# Patient Record
Sex: Female | Born: 1995 | Race: Black or African American | Hispanic: No | Marital: Single | Smoking: Never smoker
Health system: Southern US, Community
[De-identification: ages and names within clinical notes are randomized; demographics above are authoritative.]

---

## 2014-02-03 ENCOUNTER — Encounter (HOSPITAL_COMMUNITY): Payer: Self-pay | Admitting: Emergency Medicine

## 2014-02-03 ENCOUNTER — Emergency Department (HOSPITAL_COMMUNITY)
Admission: EM | Admit: 2014-02-03 | Discharge: 2014-02-03 | Disposition: A | Discharge status: Home / Self Care | Payer: BC Managed Care – PPO | Attending: Emergency Medicine | Admitting: Emergency Medicine

## 2014-02-03 DIAGNOSIS — N8320 Unspecified ovarian cysts: Secondary | ICD-10-CM | POA: Diagnosis not present

## 2014-02-03 DIAGNOSIS — N83209 Unspecified ovarian cyst, unspecified side: Secondary | ICD-10-CM

## 2014-02-03 DIAGNOSIS — R109 Unspecified abdominal pain: Secondary | ICD-10-CM | POA: Diagnosis present

## 2014-02-03 LAB — CBC WITH DIFFERENTIAL/PLATELET
BASOS ABS: 0 10*3/uL (ref 0.0–0.1)
Basophils Relative: 0 % (ref 0–1)
EOS ABS: 0 10*3/uL (ref 0.0–0.7)
EOS PCT: 0 % (ref 0–5)
HEMATOCRIT: 28.1 % — AB (ref 36.0–46.0)
Hemoglobin: 9.6 g/dL — ABNORMAL LOW (ref 12.0–15.0)
LYMPHS ABS: 1.4 10*3/uL (ref 0.7–4.0)
LYMPHS PCT: 21 % (ref 12–46)
MCH: 29.9 pg (ref 26.0–34.0)
MCHC: 34.2 g/dL (ref 30.0–36.0)
MCV: 87.5 fL (ref 78.0–100.0)
MONO ABS: 0.6 10*3/uL (ref 0.1–1.0)
Monocytes Relative: 8 % (ref 3–12)
Neutro Abs: 4.8 10*3/uL (ref 1.7–7.7)
Neutrophils Relative %: 71 % (ref 43–77)
Platelets: 225 10*3/uL (ref 150–400)
RBC: 3.21 MIL/uL — ABNORMAL LOW (ref 3.87–5.11)
RDW: 11.8 % (ref 11.5–15.5)
WBC: 6.8 10*3/uL (ref 4.0–10.5)

## 2014-02-03 LAB — BASIC METABOLIC PANEL
Anion gap: 12 (ref 5–15)
BUN: 7 mg/dL (ref 6–23)
CALCIUM: 8.8 mg/dL (ref 8.4–10.5)
CO2: 24 mEq/L (ref 19–32)
CREATININE: 0.82 mg/dL (ref 0.50–1.10)
Chloride: 104 mEq/L (ref 96–112)
GFR calc Af Amer: >90 mL/min (ref >90)
GLUCOSE: 89 mg/dL (ref 70–99)
Potassium: 3.9 mEq/L (ref 3.7–5.3)
SODIUM: 140 meq/L (ref 137–147)

## 2014-02-03 MED ORDER — OXYCODONE-ACETAMINOPHEN 5-325 MG PO TABS
1.0000 | ORAL_TABLET | Freq: Once | ORAL | Status: AC
  Administered 2014-02-03: 1 via ORAL
  Filled 2014-02-03: qty 1

## 2014-02-03 NOTE — ED Provider Notes (Signed)
CSN: [id number]     Arrival date & time 02/03/14  1322 History   First MD Initiated Contact with Patient 02/03/14 1409     Chief Complaint  Patient presents with  . Abdominal Pain   HPI Patient presents to the emergency room for a second opinion. The patient had been having trouble with abdominal pain since Wednesday of this past week. She went to [facility] yesterday. Patient states she had a full evaluation including pelvic exam and CT scan. Patient's CT scan showed that she had a ruptured ovarian cyst. The patient was admitted to the hospital for overnight observation. She saw a gynecologist as well during her hospitalization. The patient was released from the hospital today at about 11 AM. Her parents brought her to the emergency department here to make sure everything was okay. They were not sure why she was not given any antibiotics. History reviewed. No pertinent past medical history. History reviewed. No pertinent past surgical history. No family history on file. History  Substance Use Topics  . Smoking status: Never Smoker   . Smokeless tobacco: Not on file  . Alcohol Use: No   OB History    No data available     Review of Systems  All other systems reviewed and are negative.     Allergies  Review of patient's allergies indicates no known allergies.  Home Medications   Prior to Admission medications   Medication Sig Start Date End Date Taking? Authorizing Provider  HYDROcodone-acetaminophen (NORCO/VICODIN) 5-325 MG per tablet Take 1 tablet by mouth every 6 (six) hours as needed. 02/03/14   Historical Provider, MD  ibuprofen (ADVIL,MOTRIN) 800 MG tablet Take 800 mg by mouth 3 (three) times daily. 02/03/14   Historical Provider, MD   BP 101/57 mmHg  Pulse 82  Temp(Src) 98.6 F (37 C)  Resp 18  Ht 5\' 3"  (1.6 m)  Wt 115 lb (52.164 kg)  BMI 20.38 kg/m2  SpO2 100%  LMP 01/14/2014 Physical Exam  Constitutional: She appears well-developed and well-nourished.  No distress.  HENT:  Head: Normocephalic and atraumatic.  Right Ear: External ear normal.  Left Ear: External ear normal.  Eyes: Conjunctivae are normal. Right eye exhibits no discharge. Left eye exhibits no discharge. No scleral icterus.  Neck: Neck supple. No tracheal deviation present.  Cardiovascular: Normal rate, regular rhythm and intact distal pulses.   Pulmonary/Chest: Effort normal and breath sounds normal. No stridor. No respiratory distress. She has no wheezes. She has no rales.  Abdominal: Soft. Bowel sounds are normal. She exhibits no distension. There is tenderness in the right lower quadrant. There is no rebound and no guarding.  Musculoskeletal: She exhibits no edema or tenderness.  Neurological: She is alert. She has normal strength. No cranial nerve deficit (no facial droop, extraocular movements intact, no slurred speech) or sensory deficit. She exhibits normal muscle tone. She displays no seizure activity. Coordination normal.  Skin: Skin is warm and dry. No rash noted.  Psychiatric: She has a normal mood and affect.  Nursing note and vitals reviewed.   ED Course  Procedures (including critical care time) Labs Review Labs Reviewed  CBC WITH DIFFERENTIAL - Abnormal; Notable for the following:    RBC 3.21 (*)    Hemoglobin 9.6 (*)    HCT 28.1 (*)    All other components within normal limits  BASIC METABOLIC PANEL     MDM   Final diagnoses:  Hemorrhagic ovarian cyst    I was able to review  the CT scan that was performed at [facility]. This film [facility]. The patient had findings that suggested a hemorrhagic ovarian cyst. She did have a large fluid collection in the pelvis.  Patient's hemoglobin is 9.6.  She does have persistent pain but is not feeling lightheaded or weak.  I was able to review the medical records from [facility]. Patient initially had a hematocrit of 36. The patient was observed overnight. Her hematocrit  stabilized at 28.0.  Her hematocrit today in our emergency department is 28.1 which is not significant changed.  Pt has prescriptions for pain medications including lortab.  I reassured family that the plan was reasonable.    Linwood DibblesJon Sharod Petsch, MD 02/03/14 [phone]

## 2014-02-03 NOTE — ED Notes (Signed)
Pt. Stated, i was just released from [facility] with a ruptured cyst and i was released and Im still hurting and bleeding.

## 2014-02-03 NOTE — Discharge Instructions (Signed)
Ovarian Cyst An ovarian cyst is a fluid-filled sac that forms on an ovary. The ovaries are small organs that produce eggs in women. Various types of cysts can form on the ovaries. Most are not cancerous. Many do not cause problems, and they often go away on their own. Some may cause symptoms and require treatment. Common types of ovarian cysts include:  Functional cysts--These cysts may occur every month during the menstrual cycle. This is normal. The cysts usually go away with the next menstrual cycle if the woman does not get pregnant. Usually, there are no symptoms with a functional cyst.  Endometrioma cysts--These cysts form from the tissue that lines the uterus. They are also called "chocolate cysts" because they become filled with blood that turns brown. This type of cyst can cause pain in the lower abdomen during intercourse and with your menstrual period.  Cystadenoma cysts--This type develops from the cells on the outside of the ovary. These cysts can get very big and cause lower abdomen pain and pain with intercourse. This type of cyst can twist on itself, cut off its blood supply, and cause severe pain. It can also easily rupture and cause a lot of pain.  Dermoid cysts--This type of cyst is sometimes found in both ovaries. These cysts may contain different kinds of body tissue, such as skin, teeth, hair, or cartilage. They usually do not cause symptoms unless they get very big.  Theca lutein cysts--These cysts occur when too much of a certain hormone (human chorionic gonadotropin) is produced and overstimulates the ovaries to produce an egg. This is most common after procedures used to assist with the conception of a baby (in vitro fertilization). CAUSES   Fertility drugs can cause a condition in which multiple large cysts are formed on the ovaries. This is called ovarian hyperstimulation syndrome.  A condition called polycystic ovary syndrome can cause hormonal imbalances that can lead to  nonfunctional ovarian cysts. SIGNS AND SYMPTOMS  Many ovarian cysts do not cause symptoms. If symptoms are present, they may include:  Pelvic pain or pressure.  Pain in the lower abdomen.  Pain during sexual intercourse.  Increasing girth (swelling) of the abdomen.  Abnormal menstrual periods.  Increasing pain with menstrual periods.  Stopping having menstrual periods without being pregnant. DIAGNOSIS  These cysts are commonly found during a routine or annual pelvic exam. Tests may be ordered to find out more about the cyst. These tests may include:  Ultrasound.  X-ray of the pelvis.  CT scan.  MRI.  Blood tests. TREATMENT  Many ovarian cysts go away on their own without treatment. Your health care provider may want to check your cyst regularly for 2-3 months to see if it changes. For women in menopause, it is particularly important to monitor a cyst closely because of the higher rate of ovarian cancer in menopausal women. When treatment is needed, it may include any of the following:  A procedure to drain the cyst (aspiration). This may be done using a long needle and ultrasound. It can also be done through a laparoscopic procedure. This involves using a thin, lighted tube with a tiny camera on the end (laparoscope) inserted through a small incision.  Surgery to remove the whole cyst. This may be done using laparoscopic surgery or an open surgery involving a larger incision in the lower abdomen.  Hormone treatment or birth control pills. These methods are sometimes used to help dissolve a cyst. HOME CARE INSTRUCTIONS   Only take over-the-counter   or prescription medicines as directed by your health care provider.  Follow up with your health care provider as directed.  Get regular pelvic exams and Pap tests. SEEK MEDICAL CARE IF:   Your periods are late, irregular, or painful, or they stop.  Your pelvic pain or abdominal pain does not go away.  Your abdomen becomes  larger or swollen.  You have pressure on your bladder or trouble emptying your bladder completely.  You have pain during sexual intercourse.  You have feelings of fullness, pressure, or discomfort in your stomach.  You lose weight for no apparent reason.  You feel generally ill.  You become constipated.  You lose your appetite.  You develop acne.  You have an increase in body and facial hair.  You are gaining weight, without changing your exercise and eating habits.  You think you are pregnant. SEEK IMMEDIATE MEDICAL CARE IF:   You have increasing abdominal pain.  You feel sick to your stomach (nauseous), and you throw up (vomit).  You develop a fever that comes on suddenly.  You have abdominal pain during a bowel movement.  Your menstrual periods become heavier than usual. MAKE SURE YOU:  Understand these instructions.  Will watch your condition.  Will get help right away if you are not doing well or get worse. Document Released: 03/02/2005 Document Revised: 03/07/2013 Document Reviewed: 11/07/2012 ExitCare Patient Information 2015 [employer]. This information is not intended to replace advice given to you by your health care provider. Make sure you discuss any questions you have with your health care provider.  

## 2019-02-11 ENCOUNTER — Emergency Department (HOSPITAL_COMMUNITY): Payer: Self-pay

## 2019-02-11 ENCOUNTER — Other Ambulatory Visit: Payer: Self-pay

## 2019-02-11 ENCOUNTER — Emergency Department (HOSPITAL_COMMUNITY)
Admission: EM | Admit: 2019-02-11 | Discharge: 2019-02-12 | Disposition: A | Discharge status: Home / Self Care | Payer: Self-pay | Attending: Emergency Medicine | Admitting: Emergency Medicine

## 2019-02-11 ENCOUNTER — Encounter (HOSPITAL_COMMUNITY): Payer: Self-pay

## 2019-02-11 DIAGNOSIS — Z8619 Personal history of other infectious and parasitic diseases: Secondary | ICD-10-CM

## 2019-02-11 DIAGNOSIS — R0602 Shortness of breath: Secondary | ICD-10-CM | POA: Insufficient documentation

## 2019-02-11 DIAGNOSIS — M79661 Pain in right lower leg: Secondary | ICD-10-CM | POA: Insufficient documentation

## 2019-02-11 DIAGNOSIS — Z8616 Personal history of COVID-19: Secondary | ICD-10-CM

## 2019-02-11 DIAGNOSIS — R091 Pleurisy: Secondary | ICD-10-CM | POA: Insufficient documentation

## 2019-02-11 LAB — CBC WITH DIFFERENTIAL/PLATELET
Abs Immature Granulocytes: 0.03 10*3/uL (ref 0.00–0.07)
Basophils Absolute: 0 10*3/uL (ref 0.0–0.1)
Basophils Relative: 0 %
Eosinophils Absolute: 0.1 10*3/uL (ref 0.0–0.5)
Eosinophils Relative: 1 %
HCT: 36.2 % (ref 36.0–46.0)
Hemoglobin: 12.1 g/dL (ref 12.0–15.0)
Immature Granulocytes: 0 %
Lymphocytes Relative: 30 %
Lymphs Abs: 2.1 10*3/uL (ref 0.7–4.0)
MCH: 30.3 pg (ref 26.0–34.0)
MCHC: 33.4 g/dL (ref 30.0–36.0)
MCV: 90.7 fL (ref 80.0–100.0)
Monocytes Absolute: 0.5 10*3/uL (ref 0.1–1.0)
Monocytes Relative: 7 %
Neutro Abs: 4.3 10*3/uL (ref 1.7–7.7)
Neutrophils Relative %: 62 %
Platelets: 293 10*3/uL (ref 150–400)
RBC: 3.99 MIL/uL (ref 3.87–5.11)
RDW: 11.8 % (ref 11.5–15.5)
WBC: 7 10*3/uL (ref 4.0–10.5)
nRBC: 0 % (ref 0.0–0.2)

## 2019-02-11 LAB — BASIC METABOLIC PANEL
Anion gap: 9 (ref 5–15)
BUN: 13 mg/dL (ref 6–20)
CO2: 23 mmol/L (ref 22–32)
Calcium: 8.9 mg/dL (ref 8.9–10.3)
Chloride: 107 mmol/L (ref 98–111)
Creatinine, Ser: 0.87 mg/dL (ref 0.44–1.00)
GFR calc Af Amer: >60 mL/min (ref >60)
GFR calc non Af Amer: >60 mL/min (ref >60)
Glucose, Bld: 97 mg/dL (ref 70–99)
Potassium: 3.8 mmol/L (ref 3.5–5.1)
Sodium: 139 mmol/L (ref 135–145)

## 2019-02-11 LAB — POC URINE PREG, ED: Preg Test, Ur: NEGATIVE

## 2019-02-11 LAB — D-DIMER, QUANTITATIVE: D-Dimer, Quant: 0.55 ug/mL-FEU — ABNORMAL HIGH (ref 0.00–0.50)

## 2019-02-11 NOTE — ED Notes (Signed)
Orders noted

## 2019-02-11 NOTE — ED Notes (Signed)
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

## 2019-02-11 NOTE — ED Notes (Signed)
IV attempt Rexanne Mano, RN will attempt

## 2019-02-11 NOTE — ED Triage Notes (Signed)
Pt arrives from home via POV c/o SOB and chest pain. Pt reports testing Positive on Nov 13th 2020, but is now reporting testing Negative on  Nov 19th and 24th. Pt reports needing two negative results to return to work. Pt has been quarantined at home. Pt reports chest pain and SOB is intermittent. Pt has been seen at [facility] and had chest xray done but is unsure of results.

## 2019-02-11 NOTE — ED Notes (Signed)
Unsuccessful ultrasound stick x 2

## 2019-02-11 NOTE — ED Notes (Signed)
unsuccessful attempt by Cicero Duck, RN x 2   HeatherRN will attempt ultrasound IV

## 2019-02-11 NOTE — ED Notes (Signed)
Pt reports today was first day return to work   Complaint of shortness of breath and CP   Recent pos covid with two neg results after isolation

## 2019-02-11 NOTE — ED Provider Notes (Signed)
[facility] EMERGENCY DEPARTMENT Provider Note   CSN: [id number] Arrival date & time: 02/11/19  2016     History   Chief Complaint Chief Complaint  Patient presents with  . Shortness of Breath    HPI Sandy Walker is a 23 y.o. female with recent history of Covid 32, states she initially developed loss of taste and smell, then developed cough, sob and mild mid sternal chest pressure first week of November, tested positive for Covid on 11/13.  She treated her symptoms at home and was feeling improved until 5 days ago when her shortness of breath and pressure in her lower chest wall worsened with exertion and deep inspiration.  She also noted having pain without swelling in her right lower calf that same day (which has since improved).  She returned to work today for the first time (and after testing negative for Covid twice, both on the 19th and again on the 24th), but had difficulty completing her work due to [employer].  She denies fevers, chills, palpitations, dizziness. Also denies cough at this time.  She has had no treatment prior to arrival.       The history is provided by the patient.    History reviewed. No pertinent past medical history.  There are no active problems to display for this patient.   History reviewed. No pertinent surgical history.   OB History   No obstetric history on file.      Home Medications    Prior to Admission medications   Medication Sig Start Date End Date Taking? Authorizing Provider  HYDROcodone-acetaminophen (NORCO/VICODIN) 5-325 MG per tablet Take 1 tablet by mouth every 6 (six) hours as needed. 02/03/14   [provider]  ibuprofen (ADVIL,MOTRIN) 800 MG tablet Take 800 mg by mouth 3 (three) times daily. 02/03/14   [provider]    Family History No family history on file.  Social History Social History   Tobacco Use  . Smoking status: Never Smoker  Substance Use Topics  . Alcohol use: No  . Drug use: No      Allergies   Patient has no known allergies.   Review of Systems Review of Systems  Constitutional: Positive for fatigue. Negative for chills and fever.  HENT: Negative for congestion and sore throat.   Eyes: Negative.   Respiratory: Positive for chest tightness and shortness of breath. Negative for wheezing and stridor.   Cardiovascular: Negative for chest pain.  Gastrointestinal: Negative for abdominal pain, nausea and vomiting.  Genitourinary: Negative.   Musculoskeletal: Positive for arthralgias. Negative for joint swelling and neck pain.  Skin: Negative.  Negative for rash and wound.  Neurological: Negative for dizziness, weakness, light-headedness, numbness and headaches.  Psychiatric/Behavioral: Negative.      Physical Exam Updated Vital Signs BP 117/79 (BP Location: Right Arm)   Pulse 77   Temp 98.5 F (36.9 C) (Oral)   Resp 19   Ht 5\' 3"  (1.6 m)   Wt 79.4 kg   SpO2 100%   BMI 31.00 kg/m   Physical Exam Vitals signs and nursing note reviewed.  Constitutional:      Appearance: She is well-developed.  HENT:     Head: Normocephalic and atraumatic.  Eyes:     Conjunctiva/sclera: Conjunctivae normal.  Neck:     Musculoskeletal: Normal range of motion.  Cardiovascular:     Rate and Rhythm: Normal rate and regular rhythm.     Heart sounds: Normal heart sounds.  Pulmonary:  Effort: Pulmonary effort is normal.     Breath sounds: Normal breath sounds. No decreased breath sounds, wheezing or rhonchi.     Comments: Speaking in full sentences mostly,  With frequent sighing respirations, occasional need to stop mid sentence for breath.  Chest:     Chest wall: Tenderness present.     Comments: Point tenderness lower sternum.  No crepitus, deformity, edema. Abdominal:     General: Bowel sounds are normal.     Palpations: Abdomen is soft.     Tenderness: There is no abdominal tenderness.  Musculoskeletal: Normal range of motion.     Right lower leg: She exhibits  tenderness. No edema.     Left lower leg: She exhibits no tenderness. No edema.       Legs:     Comments: No tenderness of the lower extremities.  No edema, no erythema, no palpable cords.   No ankle edema. Dorsalis pedal pulse present.  Skin:    General: Skin is warm and dry.  Neurological:     Mental Status: She is alert.      ED Treatments / Results  Labs (all labs ordered are listed, but only abnormal results are displayed) Labs Reviewed  D-DIMER, QUANTITATIVE (NOT AT [facility]) - Abnormal; Notable for the following components:      Result Value   D-Dimer, Quant 0.55 (*)    All other components within normal limits  BASIC METABOLIC PANEL  CBC WITH DIFFERENTIAL/PLATELET  POC URINE PREG, ED    EKG EKG Interpretation  Date/Time:  Saturday February 11 2019 20:46:08 EST Ventricular Rate:  82 PR Interval:    QRS Duration: 70 QT Interval:  337 QTC Calculation: 394 R Axis:   52 Text Interpretation: Sinus rhythm Probable left atrial enlargement Low voltage, precordial leads Confirmed by Virgel Manifold [phone]) on 02/11/2019 9:02:42 PM   Radiology Dg Chest Portable 1 View  Result Date: 02/11/2019 CLINICAL DATA:  23 year female with chest pain and shortness of breath. EXAM: PORTABLE CHEST 1 VIEW COMPARISON:  None. FINDINGS: The heart size and mediastinal contours are within normal limits. Both lungs are clear. The visualized skeletal structures are unremarkable. IMPRESSION: No active disease. Electronically Signed   By: Anner Crete M.D.   On: 02/11/2019 23:34    Procedures Procedures (including critical care time)  Medications Ordered in ED Medications  Rivaroxaban (XARELTO) tablet 15 mg (has no administration in time range)     Initial Impression / Assessment and Plan / ED Course  I have reviewed the triage vital signs and the nursing notes.  Pertinent labs & imaging results that were available during my care of the patient were reviewed by me and  considered in my medical decision making (see chart for details).        Pt with recent recovery from Covid 19 based on 2 negative screening Covid tests, but with residual and now worsened pleuritic cp with exertional sob. Right calf pain 5 days ago, now resolved.  Attempted to obtain CT angio to rule out PE given  recent Covid illness but unable to get adequate IV access needed for IV infusion requirement, including attempting with [place].  CXR clear without other source of sx such as pneumonia. D dimer slightly elevated at 0.55 ug/mL.   Will obtain outpatient [place] to rule out DVT.  If this is negative, likelihood of PE remote.  She was given a dose of Jennye Moccasin here to cover until this study is completed. Pt understands and agrees  with plan.  She will call in the am for appt time.    If US negative, would treat for pleurisy with nsaids.  Discussed ibuprofen 600 mg tid (which she states she already has).    Final Clinical Impressions(s) / ED Diagnoses   Final diagnoses:  SOB (shortness of breath)  Pleurisy  History of 2019 novel coronavirus disease (COVID-19)    ED Discharge Orders         Ordered    US Venous Img Lower Unilateral Right     02/12/19 0049           Burgess Amordol, Dyrell Tuccillo, PA-C 02/12/19 0117    Raeford RazorKohut, Stephen, MD 02/12/19 1500

## 2019-02-12 ENCOUNTER — Ambulatory Visit (HOSPITAL_COMMUNITY)
Admission: RE | Admit: 2019-02-12 | Discharge: 2019-02-12 | Disposition: A | Discharge status: Home / Self Care | Payer: Self-pay | Source: Ambulatory Visit | Attending: Emergency Medicine | Admitting: Emergency Medicine

## 2019-02-12 DIAGNOSIS — M79661 Pain in right lower leg: Secondary | ICD-10-CM | POA: Insufficient documentation

## 2019-02-12 MED ORDER — RIVAROXABAN 15 MG PO TABS
15.0000 mg | ORAL_TABLET | Freq: Once | ORAL | Status: AC
  Administered 2019-02-12: 01:00:00 15 mg via ORAL
  Filled 2019-02-12 (×2): qty 1

## 2019-02-12 NOTE — Discharge Instructions (Signed)
As discussed, call the number circled in the morning for an appointment time to return for your ultrasound test of your right leg.  You will be advised further if this test is positive for a blood clot.

## 2019-02-12 NOTE — ED Provider Notes (Signed)
Pt returned for her [place] of her leg.  IMPRESSION: No evidence of deep venous thrombosis.  Pt notified.  She has no further questions.  She knows to return if worse.   Isla Pence, MD 02/12/19 1235

## 2019-11-01 ENCOUNTER — Other Ambulatory Visit: Payer: Self-pay

## 2019-11-01 ENCOUNTER — Ambulatory Visit
Admission: EM | Admit: 2019-11-01 | Discharge: 2019-11-01 | Disposition: A | Discharge status: Home / Self Care | Payer: BC Managed Care – PPO | Attending: Emergency Medicine | Admitting: Emergency Medicine

## 2019-11-01 ENCOUNTER — Ambulatory Visit (INDEPENDENT_AMBULATORY_CARE_PROVIDER_SITE_OTHER): Payer: BC Managed Care – PPO

## 2019-11-01 DIAGNOSIS — S99911A Unspecified injury of right ankle, initial encounter: Secondary | ICD-10-CM

## 2019-11-01 DIAGNOSIS — M25571 Pain in right ankle and joints of right foot: Secondary | ICD-10-CM | POA: Diagnosis not present

## 2019-11-01 NOTE — ED Provider Notes (Signed)
[facility]ARE [facility]   [id number] 11/01/19 Arrival Time: 1346   Chief Complaint  Patient presents with  . Ankle Injury     SUBJECTIVE: History from: patient.  Sandy Walker is a 24 y.o. female who presented to the urgent care with a complaint of right ankle pain for the past 5 days.  Reports she twisted her ankle while exercising.  Localized pain to the right ankle.  She describes the pain as constant and achy.  She has tried OTC medications without relief.  Her symptoms are made worse with ROM.  She denies similar symptoms in the past.    ROS: As per HPI.  All other pertinent ROS negative.     History reviewed. No pertinent past medical history. History reviewed. No pertinent surgical history. No Known Allergies No current facility-administered medications on file prior to encounter.   Current Outpatient Medications on File Prior to Encounter  Medication Sig Dispense Refill  . HYDROcodone-acetaminophen (NORCO/VICODIN) 5-325 MG per tablet Take 1 tablet by mouth every 6 (six) hours as needed.  0  . ibuprofen (ADVIL,MOTRIN) 800 MG tablet Take 800 mg by mouth 3 (three) times daily.  0   Social History   Socioeconomic History  . Marital status: Single    Spouse name: Not on file  . Number of children: Not on file  . Years of education: Not on file  . Highest education level: Not on file  Occupational History  . Not on file  Tobacco Use  . Smoking status: Never Smoker  Vaping Use  . Vaping Use: Never used  Substance and Sexual Activity  . Alcohol use: No  . Drug use: No  . Sexual activity: Yes  Other Topics Concern  . Not on file  Social History Narrative  . Not on file   Social Determinants of Health   Financial Resource Strain:   . Difficulty of Paying Living Expenses:   Food Insecurity:   . Worried About Programme researcher, broadcasting/film/video in the Last Year:   . Barista in the Last Year:   Transportation Needs:   . Freight forwarder (Medical):   Marland Kitchen Lack of  Transportation (Non-Medical):   Physical Activity:   . Days of Exercise per Week:   . Minutes of Exercise per Session:   Stress:   . Feeling of Stress :   Social Connections:   . Frequency of Communication with Friends and Family:   . Frequency of Social Gatherings with Friends and Family:   . Attends Religious Services:   . Active Member of Clubs or Organizations:   . Attends Banker Meetings:   Marland Kitchen Marital Status:   Intimate Partner Violence:   . Fear of Current or Ex-Partner:   . Emotionally Abused:   Marland Kitchen Physically Abused:   . Sexually Abused:    Family History  Family history unknown: Yes    OBJECTIVE:  Vitals:   11/01/19 1400  BP: 113/74  Pulse: 84  Resp: 16  Temp: 98.7 F (37.1 C)  TempSrc: Oral  SpO2: 98%     Physical Exam Vitals and nursing note reviewed.  Constitutional:      General: She is not in acute distress.    Appearance: Normal appearance. She is normal weight. She is not ill-appearing, toxic-appearing or diaphoretic.  HENT:     Head: Normocephalic.  Cardiovascular:     Rate and Rhythm: Normal rate and regular rhythm.     Pulses: Normal pulses.  Heart sounds: Normal heart sounds. No murmur heard.  No friction rub. No gallop.   Pulmonary:     Effort: Pulmonary effort is normal. No respiratory distress.     Breath sounds: Normal breath sounds. No stridor. No wheezing, rhonchi or rales.  Chest:     Chest wall: No tenderness.  Musculoskeletal:        General: Tenderness present.     Right ankle: Tenderness present.     Left ankle: Normal.     Comments: Patient is able to ambulate and bear weight with pain.  The right ankle is without obvious asymmetry or deformity when compared to the left ankle.  No surface trauma, ecchymosis, lesion and open wound.  Patient is able to flex and extend with pain, inversion and eversion.  Neurovascular status intact.  Neurological:     Mental Status: She is alert and oriented to person, place, and  time.    LABS:  No results found for this or any previous visit (from the past 24 hour(s)).   RADIOLOGY  DG Ankle Complete Right  Result Date: 11/01/2019 CLINICAL DATA:  The patient suffered a right ankle injury doing jumping jacks 5 days ago. Initial encounter. EXAM: RIGHT ANKLE - COMPLETE 3+ VIEW COMPARISON:  None. FINDINGS: There is no evidence of fracture, dislocation, or joint effusion. There is no evidence of arthropathy or other focal bone abnormality. Soft tissues are unremarkable. IMPRESSION: Negative exam. Electronically Signed   By: Drusilla Kanner M.D.   On: 11/01/2019 15:26    ASSESSMENT & PLAN:  1. Acute right ankle pain     No orders of the defined types were placed in this encounter.  Patient is stable at discharge.  Right ankle x-ray is negative for bony abnormality including fracture or dislocation.  I have reviewed the x-ray myself and the radiologist interpretation.  I am in agreement with the radiologist interpretation.  Take OTC Tylenol/ibuprofen as needed for pain.  Follow-up with PCP.   Discharge Instructions Follow RICE instruction that is attached Take OTC Tylenol/ibuprofen as needed for pain Follow-up with PCP Return or go to ED for worsening of symptoms  Reviewed expectations re: course of current medical issues. Questions answered. Outlined signs and symptoms indicating need for more acute intervention. Patient verbalized understanding. After Visit Summary given.     Note: This document was prepared using Dragon voice recognition software and may include unintentional dictation errors.     Durward Parcel, FNP 11/01/19 1547

## 2019-11-01 NOTE — Discharge Instructions (Addendum)
Follow RICE instruction that is attached Take OTC Tylenol/ibuprofen as needed for pain Follow-up with PCP Return or go to ED for worsening of symptoms 

## 2019-11-01 NOTE — ED Triage Notes (Signed)
Pt rolled right ankle 5 days ago, ambulates without difficulty

## 2020-09-02 ENCOUNTER — Other Ambulatory Visit: Payer: Self-pay

## 2020-09-02 ENCOUNTER — Emergency Department (HOSPITAL_COMMUNITY): Payer: PRIVATE HEALTH INSURANCE

## 2020-09-02 ENCOUNTER — Encounter (HOSPITAL_COMMUNITY): Payer: Self-pay | Admitting: *Deleted

## 2020-09-02 ENCOUNTER — Emergency Department (HOSPITAL_COMMUNITY)
Admission: EM | Admit: 2020-09-02 | Discharge: 2020-09-02 | Disposition: A | Discharge status: Home / Self Care | Payer: PRIVATE HEALTH INSURANCE | Attending: Emergency Medicine | Admitting: Emergency Medicine

## 2020-09-02 DIAGNOSIS — M79662 Pain in left lower leg: Secondary | ICD-10-CM | POA: Insufficient documentation

## 2020-09-02 DIAGNOSIS — M79605 Pain in left leg: Secondary | ICD-10-CM

## 2020-09-02 DIAGNOSIS — Y990 Civilian activity done for income or pay: Secondary | ICD-10-CM | POA: Insufficient documentation

## 2020-09-02 DIAGNOSIS — S8012XA Contusion of left lower leg, initial encounter: Secondary | ICD-10-CM | POA: Insufficient documentation

## 2020-09-02 DIAGNOSIS — T148XXA Other injury of unspecified body region, initial encounter: Secondary | ICD-10-CM

## 2020-09-02 DIAGNOSIS — W231XXA Caught, crushed, jammed, or pinched between stationary objects, initial encounter: Secondary | ICD-10-CM | POA: Insufficient documentation

## 2020-09-02 DIAGNOSIS — S8992XA Unspecified injury of left lower leg, initial encounter: Secondary | ICD-10-CM | POA: Diagnosis present

## 2020-09-02 MED ORDER — HYDROCODONE-ACETAMINOPHEN 5-325 MG PO TABS: 1.0000 | ORAL_TABLET | Freq: Four times a day (QID) | ORAL | 0 refills | Status: AC | PRN

## 2020-09-02 NOTE — ED Provider Notes (Signed)
[facility] EMERGENCY DEPARTMENT Provider Note   CSN: [id number] Arrival date & time: 09/02/20  1451     History Chief Complaint  Patient presents with   Leg Injury    Sandy Walker is a 25 y.o. female who presents for evaluation of LLE pain and swelling. Patient reports that on 08/24/20, she sustained a crush injury to her left lower extremity and states that these rods that weighed several 100 pounds landed on her leg.  She was seen at [facility] for evaluation of symptoms.  She was discharged home with a knee brace, crutches and states that she was instructed to follow-up with orthopedics.  She reports that since then, she has been staying off of the leg.  She states that it has been doing better.  She went back to work on Saturday and she ended up having to walk around which she states exacerbated her pain.  She reports since then, she feels like she has had worsening pain and swelling.  No new trauma, injury.  She has some numbness into the anterior aspect of her leg which she states is been ongoing no fevers, chest pain, difficulty breathing.  She is on OCPs.  No recent travel, surgeries.  The history is provided by the patient.      History reviewed. No pertinent past medical history.  There are no problems to display for this patient.   History reviewed. No pertinent surgical history.   OB History   No obstetric history on file.     Family History  Family history unknown: Yes    Social History   Tobacco Use   Smoking status: Never  Vaping Use   Vaping Use: Never used  Substance Use Topics   Alcohol use: No   Drug use: No    Home Medications Prior to Admission medications   Medication Sig Start Date End Date Taking? Authorizing Provider  HYDROcodone-acetaminophen (NORCO/VICODIN) 5-325 MG tablet Take 1 tablet by mouth every 6 (six) hours as needed. 09/02/20  Yes Maxwell Caul, PA-C  ibuprofen (ADVIL,MOTRIN) 800 MG tablet Take 800 mg by mouth 3 (three)  times daily. 02/03/14   [provider]    Allergies    Patient has no known allergies.  Review of Systems   Review of Systems  Constitutional:  Negative for fever.  Respiratory:  Negative for cough and shortness of breath.   Cardiovascular:  Negative for chest pain.  Gastrointestinal:  Negative for abdominal pain, nausea and vomiting.  Musculoskeletal:        Leg pain  Skin:  Positive for color change.  Neurological:  Positive for numbness.  All other systems reviewed and are negative.  Physical Exam Updated Vital Signs BP 106/65   Pulse 77   Temp 98.4 F (36.9 C)   Resp 18   Ht 5\' 3"  (1.6 m)   Wt 79.4 kg   SpO2 100%   BMI 31.00 kg/m   Physical Exam Vitals and nursing note reviewed.  Constitutional:      Appearance: She is well-developed.  HENT:     Head: Normocephalic and atraumatic.  Eyes:     General: No scleral icterus.       Right eye: No discharge.        Left eye: No discharge.     Conjunctiva/sclera: Conjunctivae normal.  Cardiovascular:     Pulses:          Dorsalis pedis pulses are 2+ on the right side and 2+ on  the left side.  Pulmonary:     Effort: Pulmonary effort is normal.  Musculoskeletal:     Comments: Tenderness to palpation noted to the left lower extremity noted diffusely.  She has overlying ecchymosis noted to the knee, leg as well as the distal leg.  She has some mild bony tenderness into the knee area.  No tenderness in the proximal distal tib-fib.  There is some mild diffuse soft tissue swelling.  Compartments are soft.  Dorsiflexion plantarflexion of foot intact any difficulty.  Skin:    General: Skin is warm and dry.     Capillary Refill: Capillary refill takes less than 2 seconds.     Comments: Good distal cap refill. LLE is not dusky in appearance or cool to touch.  Neurological:     Mental Status: She is alert.     Comments: Reports some decreased sensation noted to the anterior LLE which she states has been ongoing.  Sensation otherwise intact.   Psychiatric:        Speech: Speech normal.        Behavior: Behavior normal.    ED Results / Procedures / Treatments   Labs (all labs ordered are listed, but only abnormal results are displayed) Labs Reviewed - No data to display  EKG None  Radiology US Venous Img Lower  Left (DVT Study)  Result Date: 09/02/2020 CLINICAL DATA:  Left lower extremity pain. Recent injury to left lower leg. EXAM: LEFT LOWER EXTREMITY VENOUS DOPPLER ULTRASOUND TECHNIQUE: Gray-scale sonography with graded compression, as well as color Doppler and duplex ultrasound were performed to evaluate the lower extremity deep venous systems from the level of the common femoral vein and including the common femoral, femoral, profunda femoral, popliteal and calf veins including the posterior tibial, peroneal and gastrocnemius veins when visible. The superficial great saphenous vein was also interrogated. Spectral Doppler was utilized to evaluate flow at rest and with distal augmentation maneuvers in the common femoral, femoral and popliteal veins. COMPARISON:  None. FINDINGS: Contralateral Common Femoral Vein: Respiratory phasicity is normal and symmetric with the symptomatic side. No evidence of thrombus. Normal compressibility. Common Femoral Vein: No evidence of thrombus. Normal compressibility, respiratory phasicity and response to augmentation. Saphenofemoral Junction: No evidence of thrombus. Normal compressibility and flow on color Doppler imaging. Profunda Femoral Vein: No evidence of thrombus. Normal compressibility and flow on color Doppler imaging. Femoral Vein: No evidence of thrombus. Normal compressibility, respiratory phasicity and response to augmentation. Popliteal Vein: No evidence of thrombus. Normal compressibility, respiratory phasicity and response to augmentation. Calf Veins: No evidence of thrombus. Normal compressibility and flow on color Doppler imaging. Superficial Great  Saphenous Vein: No evidence of thrombus. Normal compressibility. Venous Reflux:  None. Other Findings: No evidence of superficial thrombophlebitis. A thin, elongated subcutaneous fluid collection of the medial left calf is present in the region pain and recent injury measuring 0.5 cm in thickness and up to 5.5 cm in maximal length. IMPRESSION: 1. No evidence of left lower extremity deep vein thrombosis. 2. Thin, elongated subcutaneous fluid collection in the medial left calf measuring 0.5 cm in thickness and up to 5 cm in maximal length. This likely relates to subcutaneous hemorrhage. Electronically Signed   By: Irish Lack M.D.   On: 09/02/2020 16:31   DG Knee Complete 4 Views Left  Result Date: 09/02/2020 CLINICAL DATA:  Cross injury at work on 08/24/2020. Bruising to the left leg. EXAM: LEFT KNEE - COMPLETE 4+ VIEW COMPARISON:  None. FINDINGS: No evidence of  fracture, dislocation, or joint effusion. No evidence of arthropathy or other focal bone abnormality. Soft tissues are unremarkable. IMPRESSION: Negative. Electronically Signed   By: Amie Portland M.D.   On: 09/02/2020 16:49    Procedures Procedures   Medications Ordered in ED Medications - No data to display  ED Course  I have reviewed the triage vital signs and the nursing notes.  Pertinent labs & imaging results that were available during my care of the patient were reviewed by me and considered in my medical decision making (see chart for details).    MDM Rules/Calculators/A&P                          25 year old female who presents for evaluation of left leg pain and swelling, bruising.  She reports she initially injured it on 6//22.  Was seen initially at an outside facility where work-up was reassuring.  She was discharged home with knee immobilizer, crutches.  She was scheduled to follow-up with orthopedics.  She followed up with them and they stated that they wanted to hold off on PT right now and wanted her to get outpatient  MRI.  She states she has been staying off of it and had been feeling somewhat better.  On Saturday, she went back to work and walked around she started having little bit of worsening pain and swelling.  She is also noticed the bruising has started traveling distally.  She has some numbness but states that has been ongoing since the initial injury.  Initially arrival, she is afebrile, nontoxic-appearing.  Vital signs are stable.  She has good pulses noted bilaterally.  Left lower extremity slightly swollen, ecchymotic but compartments are soft.  She can dorsiflex and plantarflex.  History/physical exam concerning for ischemic limb, septic arthritis.  History/physical exam not concerning for compartment syndrome.  I suspect this is continued from previous trauma.  Her leg is slightly more swollen than the right 1.  Will obtain ultrasound to rule out DVT as well as evaluate for any underlying hematoma, hemorrhage.  X-rays reviewed and are negative.  Ultrasound shows no evidence of DVT.  There is a thin elongated subcutaneous fluid collection in the medial left calf measuring 0.5 cm in thickness and up to 5 cm in maximal length likely related to subcutaneous hemorrhage.  Discussed results with patient.  I discussed with her regarding the worsening bruising is most likely from the continued subcutaneous hematoma.  We encouraged at home supportive care measures.  Patient reviewed on PMP.  Will give short course of pain medication for acute/breakthrough pain.  At this time, patient's exam is reassuring.  He has good pulses, good cap refill and while she has some swelling of the leg, compartments are soft and not concerning for compartment syndrome. At this time, patient exhibits no emergent life-threatening condition that require further evaluation in ED. Patient had ample opportunity for questions and discussion. All patient's questions were answered with full understanding. Strict return precautions discussed.  Patient expresses understanding and agreement to plan.   Portions of this note were generated with Scientist, clinical (histocompatibility and immunogenetics). Dictation errors may occur despite best attempts at proofreading.  Final Clinical Impression(s) / ED Diagnoses Final diagnoses:  Pain of left lower extremity  Hematoma    Rx / DC Orders ED Discharge Orders          Ordered    HYDROcodone-acetaminophen (NORCO/VICODIN) 5-325 MG tablet  Every 6 hours PRN  09/02/20 1720             Maxwell CaulLayden, Gretna Bergin A, PA-C 09/02/20 1746    Pricilla LovelessGoldston, Scott, MD 09/05/20 [phone]

## 2020-09-02 NOTE — Discharge Instructions (Signed)
As we discussed today, your x-ray and ultrasound are reassuring.  Your ultrasound did not show any evidence of a DVT, otherwise it is a blood clot.  As we discussed, there was a small fluid collection consistent with a subcutaneous hematoma likely from your trauma.  As we discussed, sometimes and this is bleeding, can follow the trend of gravity and start to spread downward.  This is likely contributing to your pain.  Keep the leg elevated.  He can apply ice to help with pain and swelling.  You can take Tylenol or Ibuprofen as directed for pain. You can alternate Tylenol and Ibuprofen every 4 hours. If you take Tylenol at 1pm, then you can take Ibuprofen at 5pm. Then you can take Tylenol again at 9pm.   Take pain medications as directed for break through pain. Do not drive or operate machinery while taking this medication.   Return to the emergency department for any worsening pain, worsening swelling, numbness or any other worsening concerning symptoms.

## 2020-09-02 NOTE — ED Triage Notes (Signed)
Crush injury to left leg at work 08-24-20. Seen at [facility] for same. Bruising noted to left leg

## 2021-12-16 IMAGING — DX DG KNEE COMPLETE 4+V*L*
4 series · 4 of 4 positions shown · non-contrast
Comparison: None.

CLINICAL DATA: Cross injury at work on 08/24/2020. Bruising to the
left leg.

EXAM:
LEFT KNEE - COMPLETE 4+ VIEW

[knee ap]
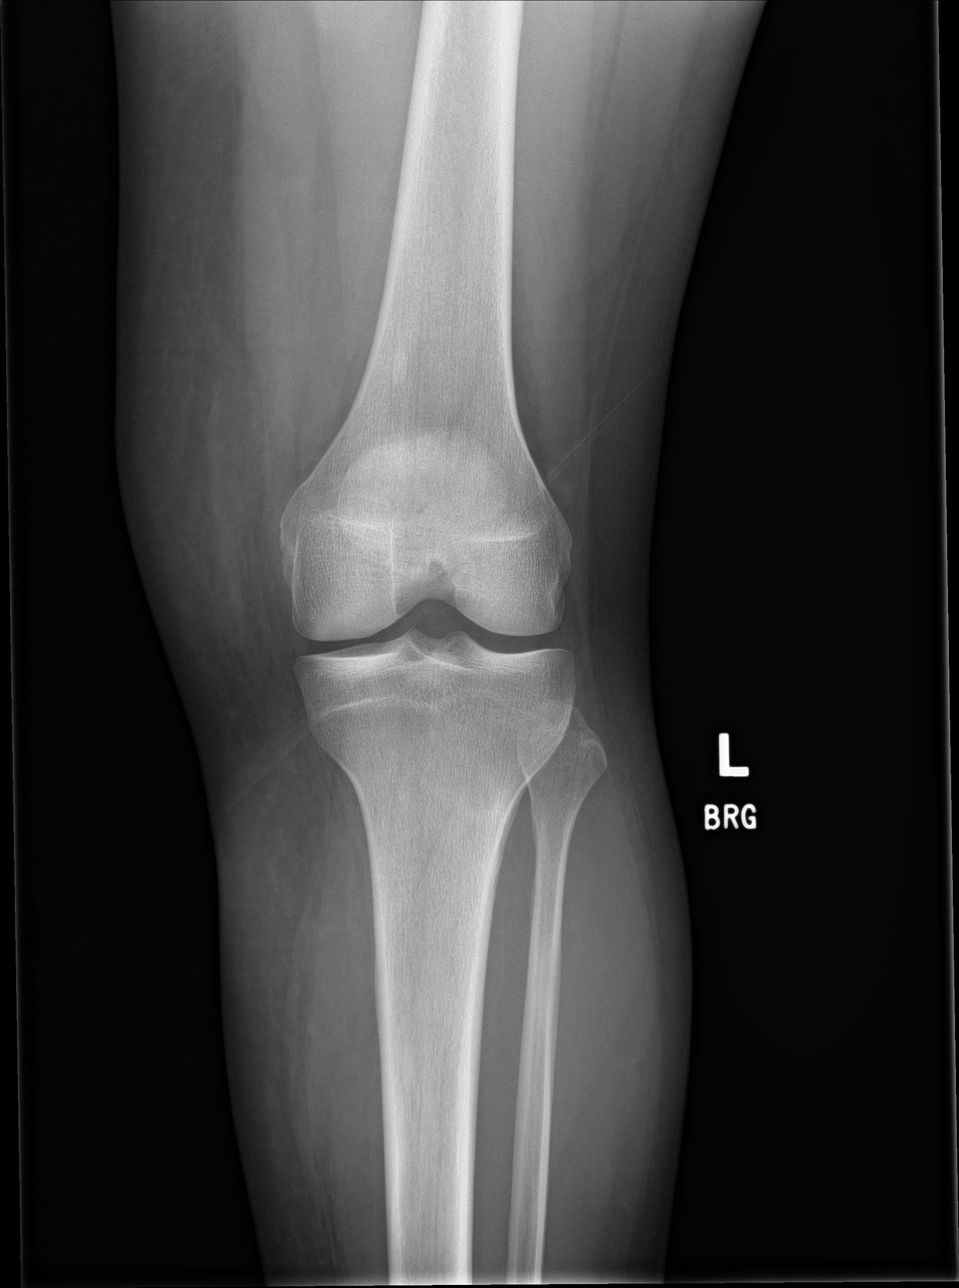

[knee lat]
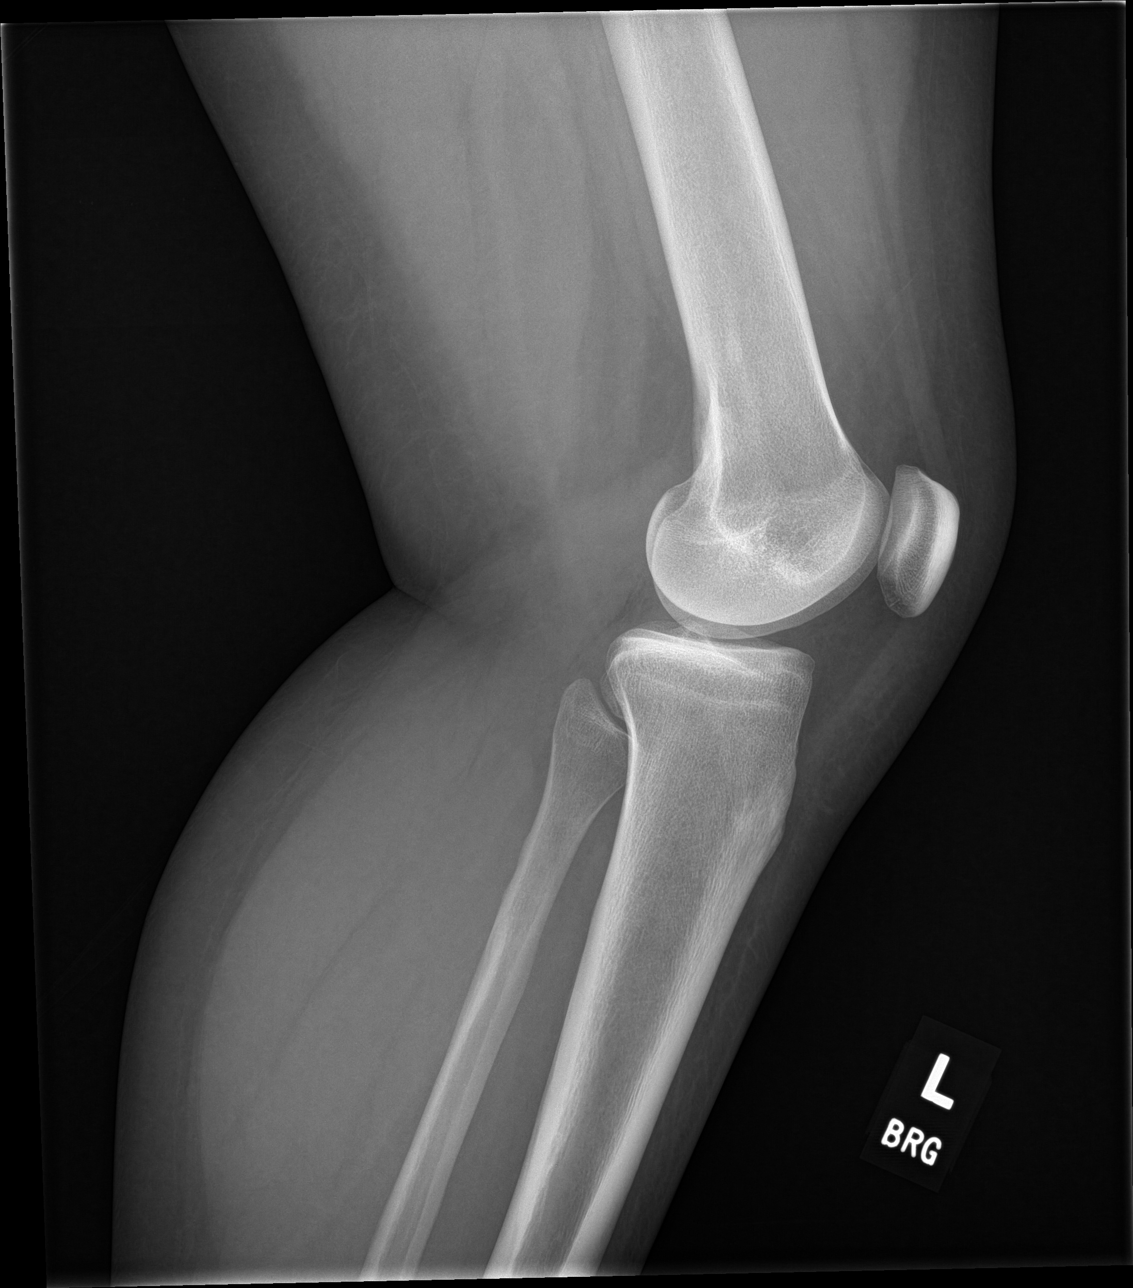

[knee obl (1 of 2)]
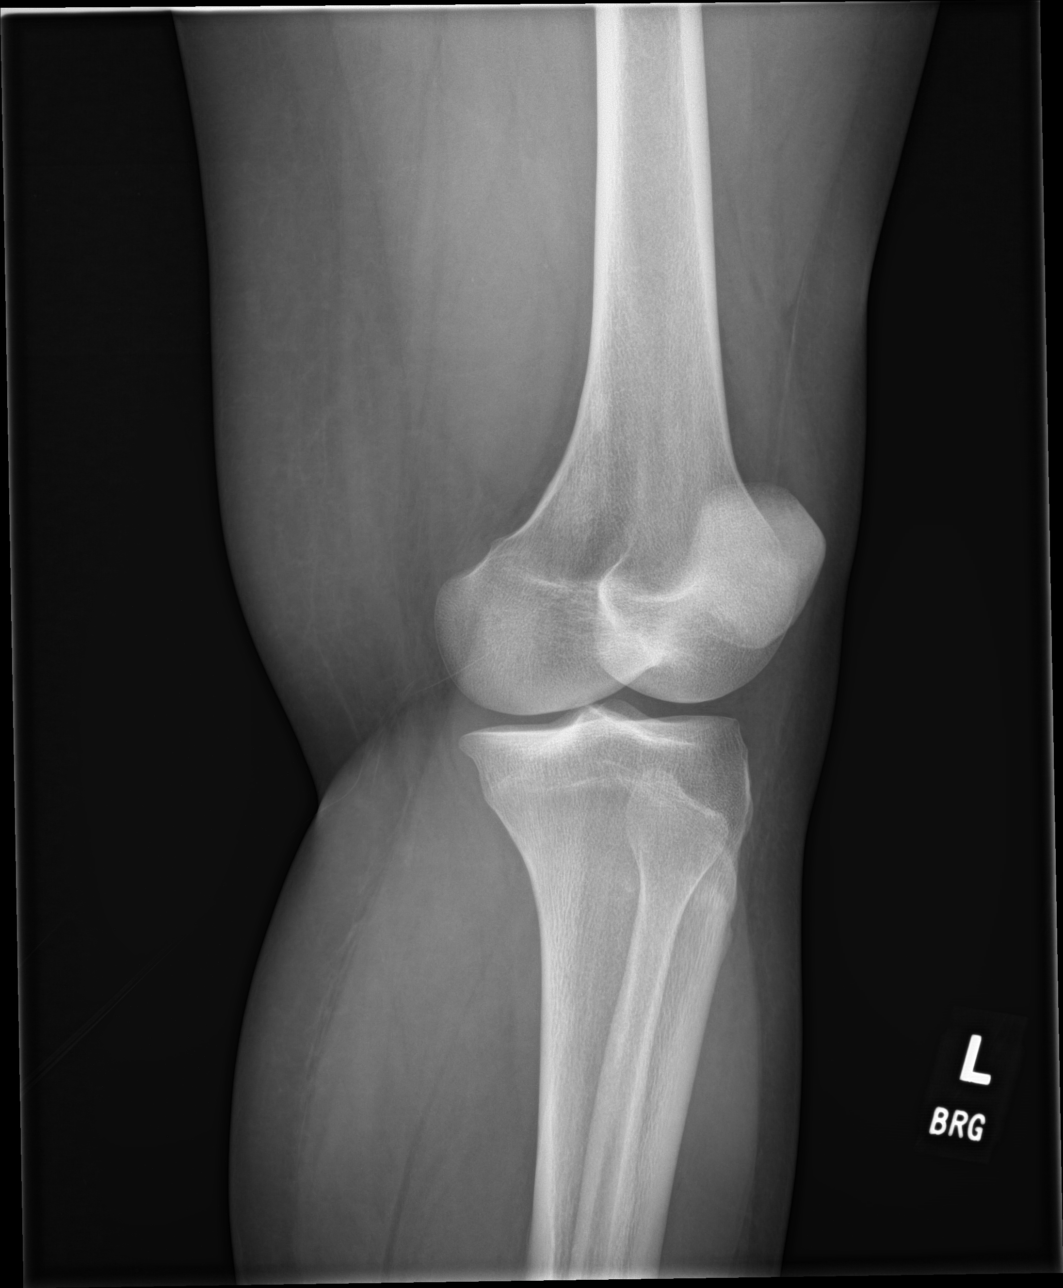

[knee obl (2 of 2)]
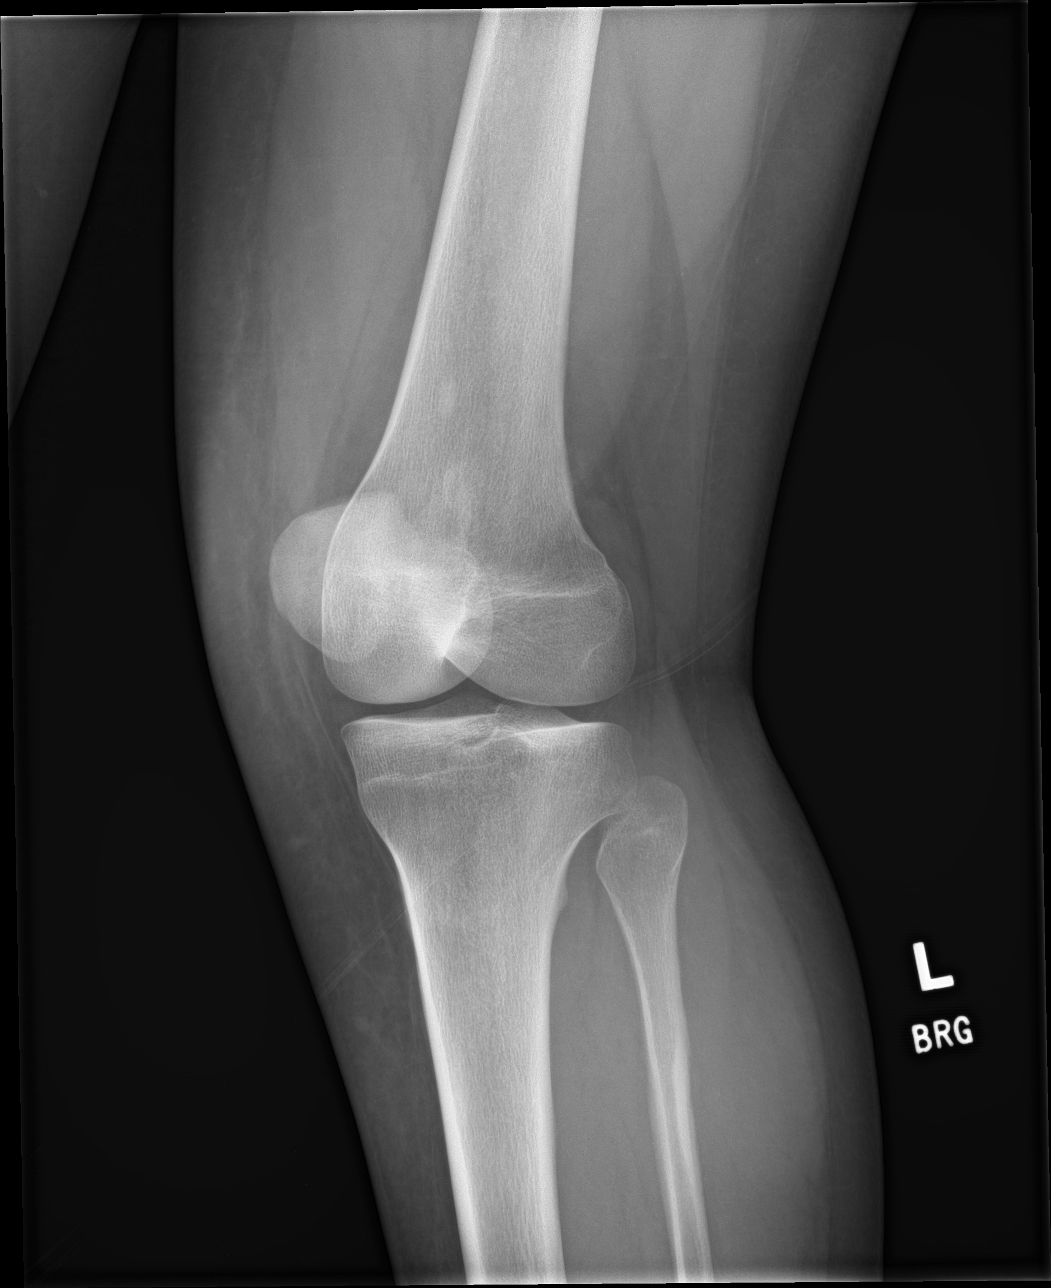

[4 of 4 positions shown; findings below may reference images not displayed]

FINDINGS: No evidence of fracture, dislocation, or joint effusion. No evidence
of arthropathy or other focal bone abnormality. Soft tissues are
unremarkable.
IMPRESSION: Negative.

## 2022-05-23 ENCOUNTER — Other Ambulatory Visit: Payer: Self-pay

## 2022-05-23 ENCOUNTER — Encounter (HOSPITAL_COMMUNITY): Payer: Self-pay

## 2022-05-23 ENCOUNTER — Emergency Department (HOSPITAL_COMMUNITY)
Admission: EM | Admit: 2022-05-23 | Discharge: 2022-05-23 | Disposition: A | Discharge status: Home / Self Care | Payer: Medicaid Other | Attending: Emergency Medicine | Admitting: Emergency Medicine

## 2022-05-23 DIAGNOSIS — J101 Influenza due to other identified influenza virus with other respiratory manifestations: Secondary | ICD-10-CM | POA: Diagnosis not present

## 2022-05-23 DIAGNOSIS — Z20822 Contact with and (suspected) exposure to covid-19: Secondary | ICD-10-CM | POA: Insufficient documentation

## 2022-05-23 DIAGNOSIS — R509 Fever, unspecified: Secondary | ICD-10-CM | POA: Diagnosis present

## 2022-05-23 DIAGNOSIS — J111 Influenza due to unidentified influenza virus with other respiratory manifestations: Secondary | ICD-10-CM

## 2022-05-23 LAB — RESP PANEL BY RT-PCR (RSV, FLU A&B, COVID)  RVPGX2
Influenza A by PCR: POSITIVE — AB
Influenza B by PCR: NEGATIVE
Resp Syncytial Virus by PCR: NEGATIVE
SARS Coronavirus 2 by RT PCR: NEGATIVE

## 2022-05-23 MED ORDER — ACETAMINOPHEN 325 MG PO TABS
650.0000 mg | ORAL_TABLET | Freq: Once | ORAL | Status: AC | PRN
  Administered 2022-05-23: 650 mg via ORAL
  Filled 2022-05-23: qty 2

## 2022-05-23 MED ORDER — OSELTAMIVIR PHOSPHATE 75 MG PO CAPS: 75.0000 mg | ORAL_CAPSULE | Freq: Two times a day (BID) | ORAL | 0 refills | Status: AC

## 2022-05-23 MED ORDER — NAPROXEN 500 MG PO TABS: 500.0000 mg | ORAL_TABLET | Freq: Two times a day (BID) | ORAL | 0 refills | Status: AC

## 2022-05-23 MED ORDER — ONDANSETRON 4 MG PO TBDP: 4.0000 mg | ORAL_TABLET | Freq: Three times a day (TID) | ORAL | 0 refills | Status: AC | PRN

## 2022-05-23 NOTE — ED Triage Notes (Signed)
Pt arrived from home via POV c/o headache, chills, body aches. Cough. Fever. Emesis x 1.Diarrhea x 1.  Bilateral ear pain. All starting today. Pt recently exposed to flu

## 2022-05-23 NOTE — Discharge Instructions (Signed)
You were evaluated in the Emergency Department and after careful evaluation, we did not find any emergent condition requiring admission or further testing in the hospital.  Your exam/testing today was overall reassuring.  Symptoms seem to be due to the flu.  Use the Zofran medication as needed for nausea.  Use the Naprosyn medication twice daily as needed for discomfort.  Use the Tamiflu to help you get better faster.  Please return to the Emergency Department if you experience any worsening of your condition.  Thank you for allowing [place] to be a part of your care.

## 2022-05-23 NOTE — ED Provider Notes (Signed)
[facility] Emergency Department Provider Note MRN:  [id number]  Arrival date & time: 05/23/22     Chief Complaint   multiple complaints   History of Present Illness   Sandy Walker is a 27 y.o. year-old female with no pertinent past medical history presenting to the ED with chief complaint of multiple complaints.  Fever, headaches, body aches, cough, nausea vomiting x 1.  All started today.  Review of Systems  A thorough review of systems was obtained and all systems are negative except as noted in the HPI and PMH.   Patient's Health History   History reviewed. No pertinent past medical history.  History reviewed. No pertinent surgical history.  Family History  Family history unknown: Yes    Social History   Socioeconomic History   Marital status: Single    Spouse name: Not on file   Number of children: Not on file   Years of education: Not on file   Highest education level: Not on file  Occupational History   Not on file  Tobacco Use   Smoking status: Never   Smokeless tobacco: Not on file  Vaping Use   Vaping Use: Never used  Substance and Sexual Activity   Alcohol use: No   Drug use: No   Sexual activity: Yes  Other Topics Concern   Not on file  Social History Narrative   Not on file   Social Determinants of Health   Financial Resource Strain: Not on file  Food Insecurity: Not on file  Transportation Needs: Not on file  Physical Activity: Not on file  Stress: Not on file  Social Connections: Not on file  Intimate Partner Violence: Not on file     Physical Exam   Vitals:   05/23/22 2221 05/23/22 2326  BP: 113/69   Pulse: (!) 124   Resp: 18   Temp: (!) 101.5 F (38.6 C) (!) 100.4 F (38 C)  SpO2: 97%     CONSTITUTIONAL: Well-appearing, NAD NEURO/PSYCH:  Alert and oriented x 3, no focal deficits, no meningismus EYES:  eyes equal and reactive ENT/NECK:  no LAD, no JVD CARDIO: Regular rate, well-perfused, normal S1  and S2 PULM:  CTAB no wheezing or rhonchi GI/GU:  non-distended, non-tender MSK/SPINE:  No gross deformities, no edema SKIN:  no rash, atraumatic   *Additional and/or pertinent findings included in MDM below  Diagnostic and Interventional Summary    EKG Interpretation  Date/Time:    Ventricular Rate:    PR Interval:    QRS Duration:   QT Interval:    QTC Calculation:   R Axis:     Text Interpretation:         Labs Reviewed  RESP PANEL BY RT-PCR (RSV, FLU A&B, COVID)  [id number] - Abnormal; Notable for the following components:      Result Value   Influenza A by PCR POSITIVE (*)    All other components within normal limits    No orders to display    Medications  acetaminophen (TYLENOL) tablet 650 mg (650 mg Oral Given 05/23/22 2228)     Procedures  /  Critical Care Procedures  ED Course and Medical Decision Making  Initial Impression and Ddx Patient arrives febrile with constellation of symptoms suggestive of viral illness.  Suspect flu, has had exposure.  A bit tachycardic on arrival but this has improved on my assessment heart rate in the 90s.  No meningismus, clear lungs, soft abdomen, no other concerning symptoms.  Past medical/surgical history that increases complexity of ED encounter: None  Interpretation of Diagnostics Flu positive  Patient Reassessment and Ultimate Disposition/Management     Appropriate for discharge with symptomatic management.  Patient management required discussion with the following services or consulting groups:  None  Complexity of Problems Addressed Acute complicated illness or Injury  Additional Data Reviewed and Analyzed Further history obtained from: None  Additional Factors Impacting ED Encounter Risk Prescriptions  Barth Kirks. Sedonia Small, Sachse [email or web address]  Final Clinical Impressions(s) / ED Diagnoses     ICD-10-CM   1. Influenza  J11.1       ED Discharge  Orders          Ordered    ondansetron (ZOFRAN-ODT) 4 MG disintegrating tablet  Every 8 hours PRN        05/23/22 2334    naproxen (NAPROSYN) 500 MG tablet  2 times daily        05/23/22 2334    oseltamivir (TAMIFLU) 75 MG capsule  Every 12 hours        05/23/22 2334             Discharge Instructions Discussed with and Provided to Patient:    Discharge Instructions      You were evaluated in the Emergency Department and after careful evaluation, we did not find any emergent condition requiring admission or further testing in the hospital.  Your exam/testing today was overall reassuring.  Symptoms seem to be due to the flu.  Use the Zofran medication as needed for nausea.  Use the Naprosyn medication twice daily as needed for discomfort.  Use the Tamiflu to help you get better faster.  Please return to the Emergency Department if you experience any worsening of your condition.  Thank you for allowing [place] to be a part of your care.       Maudie Flakes, MD 05/23/22 2337

## 2022-10-23 DIAGNOSIS — R109 Unspecified abdominal pain: Secondary | ICD-10-CM | POA: Diagnosis not present

## 2022-10-23 DIAGNOSIS — R519 Headache, unspecified: Secondary | ICD-10-CM | POA: Diagnosis not present

## 2022-10-23 DIAGNOSIS — R5383 Other fatigue: Secondary | ICD-10-CM | POA: Diagnosis not present

## 2022-10-23 DIAGNOSIS — N3000 Acute cystitis without hematuria: Secondary | ICD-10-CM | POA: Diagnosis not present

## 2022-10-23 DIAGNOSIS — Z7951 Long term (current) use of inhaled steroids: Secondary | ICD-10-CM | POA: Diagnosis not present

## 2022-10-23 DIAGNOSIS — Z1152 Encounter for screening for COVID-19: Secondary | ICD-10-CM | POA: Diagnosis not present

## 2022-10-29 DIAGNOSIS — R197 Diarrhea, unspecified: Secondary | ICD-10-CM | POA: Diagnosis not present

## 2022-10-29 DIAGNOSIS — R112 Nausea with vomiting, unspecified: Secondary | ICD-10-CM | POA: Diagnosis not present

## 2022-10-29 DIAGNOSIS — Z79899 Other long term (current) drug therapy: Secondary | ICD-10-CM | POA: Diagnosis not present

## 2022-10-29 DIAGNOSIS — K529 Noninfective gastroenteritis and colitis, unspecified: Secondary | ICD-10-CM | POA: Diagnosis not present

## 2022-10-29 DIAGNOSIS — Z20822 Contact with and (suspected) exposure to covid-19: Secondary | ICD-10-CM | POA: Diagnosis not present

## 2022-10-29 DIAGNOSIS — R1110 Vomiting, unspecified: Secondary | ICD-10-CM | POA: Diagnosis not present
